# Patient Record
Sex: Male | Born: 1935 | Race: Black or African American | Hispanic: No | Smoking: Former smoker
Health system: Southern US, Community
[De-identification: ages and names within clinical notes are randomized; demographics above are authoritative.]

## PROBLEM LIST (undated history)

## (undated) DIAGNOSIS — I639 Cerebral infarction, unspecified: Secondary | ICD-10-CM

## (undated) HISTORY — DX: Cerebral infarction, unspecified: I63.9

## (undated) HISTORY — PX: VASECTOMY: SHX75

---

## 2001-09-20 ENCOUNTER — Ambulatory Visit (HOSPITAL_BASED_OUTPATIENT_CLINIC_OR_DEPARTMENT_OTHER): Admission: RE | Admit: 2001-09-20 | Discharge: 2001-09-20 | Payer: Self-pay | Admitting: General Surgery

## 2002-12-02 ENCOUNTER — Encounter: Payer: Self-pay | Admitting: Neurology

## 2002-12-02 ENCOUNTER — Encounter: Payer: Self-pay | Admitting: Emergency Medicine

## 2002-12-02 ENCOUNTER — Inpatient Hospital Stay (HOSPITAL_COMMUNITY): Admission: EM | Admit: 2002-12-02 | Discharge: 2002-12-07 | Payer: Self-pay | Admitting: Emergency Medicine

## 2002-12-04 ENCOUNTER — Encounter: Payer: Self-pay | Admitting: Neurology

## 2002-12-05 ENCOUNTER — Encounter: Payer: Self-pay | Admitting: Family Medicine

## 2002-12-07 ENCOUNTER — Inpatient Hospital Stay (HOSPITAL_COMMUNITY)
Admission: RE | Admit: 2002-12-07 | Discharge: 2003-01-10 | Payer: Self-pay | Admitting: Physical Medicine & Rehabilitation

## 2003-01-23 ENCOUNTER — Ambulatory Visit (HOSPITAL_COMMUNITY): Admission: RE | Admit: 2003-01-23 | Discharge: 2003-01-23 | Payer: Self-pay | Admitting: Internal Medicine

## 2003-07-12 ENCOUNTER — Ambulatory Visit (HOSPITAL_COMMUNITY): Admission: RE | Admit: 2003-07-12 | Discharge: 2003-07-12 | Payer: Self-pay | Admitting: Neurology

## 2010-03-15 ENCOUNTER — Encounter: Payer: Self-pay | Admitting: Physical Medicine & Rehabilitation

## 2010-03-15 ENCOUNTER — Encounter: Payer: Self-pay | Admitting: Internal Medicine

## 2010-03-16 ENCOUNTER — Encounter: Payer: Self-pay | Admitting: Neurology

## 2016-08-27 ENCOUNTER — Encounter: Payer: Self-pay | Admitting: Emergency Medicine

## 2016-08-27 ENCOUNTER — Ambulatory Visit (INDEPENDENT_AMBULATORY_CARE_PROVIDER_SITE_OTHER): Payer: Medicare Other

## 2016-08-27 ENCOUNTER — Ambulatory Visit (INDEPENDENT_AMBULATORY_CARE_PROVIDER_SITE_OTHER): Payer: Medicare Other | Admitting: Emergency Medicine

## 2016-08-27 VITALS — BP 138/74 | HR 64 | Temp 97.9°F | Resp 16

## 2016-08-27 DIAGNOSIS — R918 Other nonspecific abnormal finding of lung field: Secondary | ICD-10-CM | POA: Diagnosis not present

## 2016-08-27 DIAGNOSIS — S4991XA Unspecified injury of right shoulder and upper arm, initial encounter: Secondary | ICD-10-CM

## 2016-08-27 DIAGNOSIS — S40021A Contusion of right upper arm, initial encounter: Secondary | ICD-10-CM | POA: Insufficient documentation

## 2016-08-27 NOTE — Patient Instructions (Addendum)
     IF you received an x-ray today, you will receive an invoice from [facility]. Please contact [facility] at [phone] with questions or concerns regarding your invoice.   IF you received labwork today, you will receive an invoice from [facility]. Please contact [facility] at [phone] with questions or concerns regarding your invoice.   Our billing staff will not be able to assist you with questions regarding bills from these companies.  You will be contacted with the lab results as soon as they are available. The fastest way to get your results is to activate your My Chart account. Instructions are located on the last page of this paperwork. If you have not heard from us regarding the results in 2 weeks, please contact this office.     Contusion A contusion is a deep bruise. Contusions happen when an injury causes bleeding under the skin. Symptoms of bruising include pain, swelling, and discolored skin. The skin may turn blue, purple, or yellow. Follow these instructions at home:  Rest the injured area.  If told, put ice on the injured area. ? Put ice in a plastic bag. ? Place a towel between your skin and the bag. ? Leave the ice on for 20 minutes, 2-3 times per day.  If told, put light pressure (compression) on the injured area using an elastic bandage. Make sure the bandage is not too tight. Remove it and put it back on as told by your doctor.  If possible, raise (elevate) the injured area above the level of your heart while you are sitting or lying down.  Take over-the-counter and prescription medicines only as told by your doctor. Contact a doctor if:  Your symptoms do not get better after several days of treatment.  Your symptoms get worse.  You have trouble moving the injured area. Get help right away if:  You have very bad pain.  You have a loss of feeling (numbness) in a hand or foot.  Your hand or foot turns pale or cold. This information  is not intended to replace advice given to you by your health care provider. Make sure you discuss any questions you have with your health care provider. Document Released: 07/29/2007 Document Revised: 07/18/2015 Document Reviewed: 06/27/2014 Elsevier Interactive Patient Education  2018 Elsevier Inc.  

## 2016-08-27 NOTE — Progress Notes (Signed)
Joseph Nielsen 83 y.o.   Chief Complaint  Patient presents with  . Fall    fell at home on Monday, didn't hit any objects, per pt  complains that his right side of his body is hurting.    HISTORY OF PRESENT ILLNESS: This is a 83 y.o. male fell last Monday and injured right arm, c/o pain since. 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  HPI   Prior to Admission medications   Medication Sig Start Date End Date Taking? Authorizing Provider  ASPIRIN 81 PO Take by mouth.   Yes [provider]  Multiple Vitamin (MULTIVITAMIN) tablet Take 1 tablet by mouth daily.   Yes [provider]    Not on File  There are no active problems to display for this patient.   Past Medical History:  Diagnosis Date  . Stroke [facility](HCC)     Past Surgical History:  Procedure Laterality Date  . VASECTOMY      Social History   Social History  . Marital status: Widowed    Spouse name: N/A  . Number of children: N/A  . Years of education: N/A   Occupational History  . Not on file.   Social History Main Topics  . Smoking status: Former Smoker    Types: Cigarettes  . Smokeless tobacco: Never Used     Comment: Quit 2004  . Alcohol use No  . Drug use: Unknown  . Sexual activity: Not on file   Other Topics Concern  . Not on file   Social History Narrative  . No narrative on file    Family History  Problem Relation Age of Onset  . Cancer Mother   . Heart disease Father      ROS   Physical Exam  Constitutional: He appears well-developed and well-nourished.  Wheel-chair bound.  HENT:  Head: Normocephalic and atraumatic.  Eyes: EOM are normal. Pupils are equal, round, and reactive to light.  Neck: Normal range of motion. Neck supple.  Cardiovascular: Normal rate.   Pulmonary/Chest: Effort normal.  Abdominal: Soft.  Musculoskeletal:  RUE: mild tenderness to upper arm; rest WNL.    Neurological: He is alert.  Residual  right sided weakness; right arm contracted with muscle atrophy +speech deficit  Skin: Skin is warm and dry. Capillary refill takes less than 2 seconds.  Vitals reviewed.  Dg Chest 2 View  Result Date: 08/27/2016 CLINICAL DATA:  A VATS.  Right shoulder injury EXAM: CHEST  2 VIEW COMPARISON:  Report of a chest x-ray of December 02, 2002 FINDINGS: The lungs are well-expanded. There is an approximately 2.5 x 2 cm spiculated mass in the inferolateral aspect of the right upper lobe. Elsewhere the lungs are clear. The heart and pulmonary vascularity are normal. There is calcification in the wall of the aortic arch. There is a trace of pleural fluid blunting the posterior costophrenic angle on the right. The observed bony thorax exhibits no acute abnormality. IMPRESSION: Abnormal spiculated mass in the right upper lobe worrisome for malignancy. Small right pleural effusion. Probable underlying chronic bronchitic -smoking related changes. Thoracic aortic atherosclerosis. Electronically Signed   By: Joseph  Nielsen M.D.   On: 08/27/2016 15:10   Dg Shoulder Right  Result Date: 08/27/2016 CLINICAL DATA:  Larey SeatFell several days ago with right shoulder pain EXAM: RIGHT SHOULDER - 2+ VIEW COMPARISON:  None. FINDINGS: The right humeral head is in normal position, minimally subluxed, and the glenohumeral joint space appears normal.  The right AC joint shows only mild degenerative joint disease. No acute fracture is seen. The bones are osteopenic. However, within the periphery of the right upper lobe there is a rounded nodular lesion present worrisome for neoplasm, either primary or metastatic. CT of the chest with IV contrast media is recommended to evaluate further. IMPRESSION: 1. No fracture or dislocation.  Diffuse osteopenia. 2. Rounded mass of 2.5 cm in the periphery of the right upper lobe worrisome for either primary or metastatic disease. Recommend CT of the chest with IV contrast media. Electronically Signed   By: Joseph Nielsen M.D.   On: 08/27/2016 14:22     ASSESSMENT & PLAN: Tania was seen today for fall.  Diagnoses and all orders for this visit:  Injury of right shoulder, initial encounter -     DG Shoulder Right; Future -     DG Chest 2 View; Future  Arm contusion, right, initial encounter  Mass of upper lobe of right lung    Patient Instructions       IF you received an x-ray today, you will receive an invoice from [facility]. Please contact [facility] at [phone] with questions or concerns regarding your invoice.   IF you received labwork today, you will receive an invoice from [facility]. Please contact [facility] at [phone] with questions or concerns regarding your invoice.   Our billing staff will not be able to assist you with questions regarding bills from these companies.  You will be contacted with the lab results as soon as they are available. The fastest way to get your results is to activate your My Chart account. Instructions are located on the last page of this paperwork. If you have not heard from [place] regarding the results in 2 weeks, please contact this office.     Contusion A contusion is a deep bruise. Contusions happen when an injury causes bleeding under the skin. Symptoms of bruising include pain, swelling, and discolored skin. The skin may turn blue, purple, or yellow. Follow these instructions at home:  Rest the injured area.  If told, put ice on the injured area. ? Put ice in a plastic bag. ? Place a towel between your skin and the bag. ? Leave the ice on for 20 minutes, 2-3 times per day.  If told, put light pressure (compression) on the injured area using an elastic bandage. Make sure the bandage is not too tight. Remove it and put it back on as told by your doctor.  If possible, raise (elevate) the injured area above the level of your heart while you are sitting or lying down.  Take over-the-counter and prescription medicines only  as told by your doctor. Contact a doctor if:  Your symptoms do not get better after several days of treatment.  Your symptoms get worse.  You have trouble moving the injured area. Get help right away if:  You have very bad pain.  You have a loss of feeling (numbness) in a hand or foot.  Your hand or foot turns pale or cold. This information is not intended to replace advice given to you by your health care provider. Make sure you discuss any questions you have with your health care provider. Document Released: 07/29/2007 Document Revised: 07/18/2015 Document Reviewed: 06/27/2014 Elsevier Interactive Patient Education  2018 Elsevier Inc.      Edwina Barth, MD [facility]

## 2016-11-27 ENCOUNTER — Other Ambulatory Visit: Payer: Self-pay | Admitting: Family Medicine

## 2016-11-27 DIAGNOSIS — R918 Other nonspecific abnormal finding of lung field: Secondary | ICD-10-CM

## 2016-12-01 ENCOUNTER — Other Ambulatory Visit: Payer: Self-pay

## 2018-01-12 ENCOUNTER — Ambulatory Visit: Payer: Self-pay | Admitting: *Deleted

## 2018-01-12 NOTE — Telephone Encounter (Signed)
Pt's daughter calling, pt present during call. Pt has expressive aphasia with H/O CVA. Reports increased edema both feet and ankles, non-pitting. No redness, warmth. States right foot only with mild tenderness. Daughter states has caregiver during the day and feet were not elevated; pt is wheelchair dependant. Also reports mild facial "Puffiness" around eyes. States has had some congestion "For a while." No cough but "I can tell he has some congestion." Afebrile, denies any SOB, CP or tightness. Appt made for tomorrow with Dr. Clelia CroftShaw as no availability with Dr. Alvy BimlerSagardia or PAs. Care advise given per protocol.  Reason for Disposition . [1] MODERATE leg swelling (e.g., swelling extends up to knees) AND [2] new onset or worsening    With mild facial swelling and congestion  Answer Assessment - Initial Assessment Questions 1. ONSET: "When did the swelling start?" (e.g., minutes, hours, days)     Today 2. LOCATION: "What part of the leg is swollen?"  "Are both legs swollen or just one leg?"     Both legs 3. SEVERITY: "How bad is the swelling?" (e.g., localized; mild, moderate, severe)  - Localized - small area of swelling localized to one leg  - MILD pedal edema - swelling limited to foot and ankle, pitting edema < 1/4 inch (6 mm) deep, rest and elevation eliminate most or all swelling  - MODERATE edema - swelling of lower leg to knee, pitting edema > 1/4 inch (6 mm) deep, rest and elevation only partially reduce swelling  - SEVERE edema - swelling extends above knee, facial or hand swelling present      Ankles and feet 4. REDNESS: "Does the swelling look red or infected?"     no 5. PAIN: "Is the swelling painful to touch?" If so, ask: "How painful is it?"   (Scale 1-10; mild, moderate or severe)    Right foot tender, mild 6. FEVER: "Do you have a fever?" If so, ask: "What is it, how was it measured, and when did it start?"      no 7. CAUSE: "What do you think is causing the leg swelling?"  "Legs were not elevated during the day."  per daughter 8. MEDICAL HISTORY: "Do you have a history of heart failure, kidney disease, liver failure, or cancer?"     *No Answer* 9. RECURRENT SYMPTOM: "Have you had leg swelling before?" If so, ask: "When was the last time?" "What happened that time?"     *No Answer* 10. OTHER SYMPTOMS: "Do you have any other symptoms?" (e.g., chest pain, difficulty breathing)       Facial swelling, mild around eyes; "Sounds congested."  Protocols used: LEG SWELLING AND EDEMA-A-AH

## 2018-01-13 ENCOUNTER — Ambulatory Visit: Payer: Self-pay | Admitting: *Deleted

## 2018-01-13 ENCOUNTER — Ambulatory Visit: Payer: Self-pay | Admitting: Family Medicine

## 2018-01-23 NOTE — Telephone Encounter (Signed)
[facility]. They noted there were called to home by pt's daughter who found him in bed. She had tried to move him to the floor to start CPR but rigor mortis just started to set in (his hand was grasped firmly to bed rail and daughter couldn't hardly it off so she could move him.) EMS immediately pronounced pt DOA. Request PCP Dr. Alvy BimlerSagardia to sign off on death certificate.  Reviewed chart and pt does have Sagardia listed as PCP. This 84 yo has only been seen in our office cone ~1.5 yrs ago but noted h/o CVA with expressive aphasia and wheelchair dependent as well as Rt lung mass at that visit.  Pt daughter called office yeast noting pt had increasing edema in B lower ext and right foot mildly tender. Also c/o some subtle congestion "for a while."  Appt was made for me for today.    Agreed that Alvy BimlerSagardia would likely be willing to sign of on death certificate. They noted that [facility] will get certificate to us.

## 2018-01-23 NOTE — Telephone Encounter (Signed)
EMS, called to reach a doctor on call to pronounce a death. They were called to the patient's home and he was not breathing.  On call provider notified to pronounce death. Dr. Clelia CroftShaw notified and conference in to speak with EMS.  Reason for Disposition . Notification of death  Answer Assessment - Initial Assessment Questions 1. REASON FOR CALL or QUESTION: "What is your reason for calling today?" or "How can I best help you?" or "What question do you have that I can help answer?"     EMS calling for the doctor on call to pronounce a death 2. CALLER: Document the source of call. (e.g., laboratory, patient).     EMS  Protocols used: PCP CALL - NO TRIAGE-A-AH

## 2018-01-23 DEATH — deceased

## 2018-02-26 IMAGING — DX DG CHEST 2V
2 series · 2 of 2 positions shown · non-contrast
Comparison: Report of a chest x-ray December 02, 2002

CLINICAL DATA: A VATS.  Right shoulder injury

EXAM:
CHEST  2 VIEW

[chest pa]
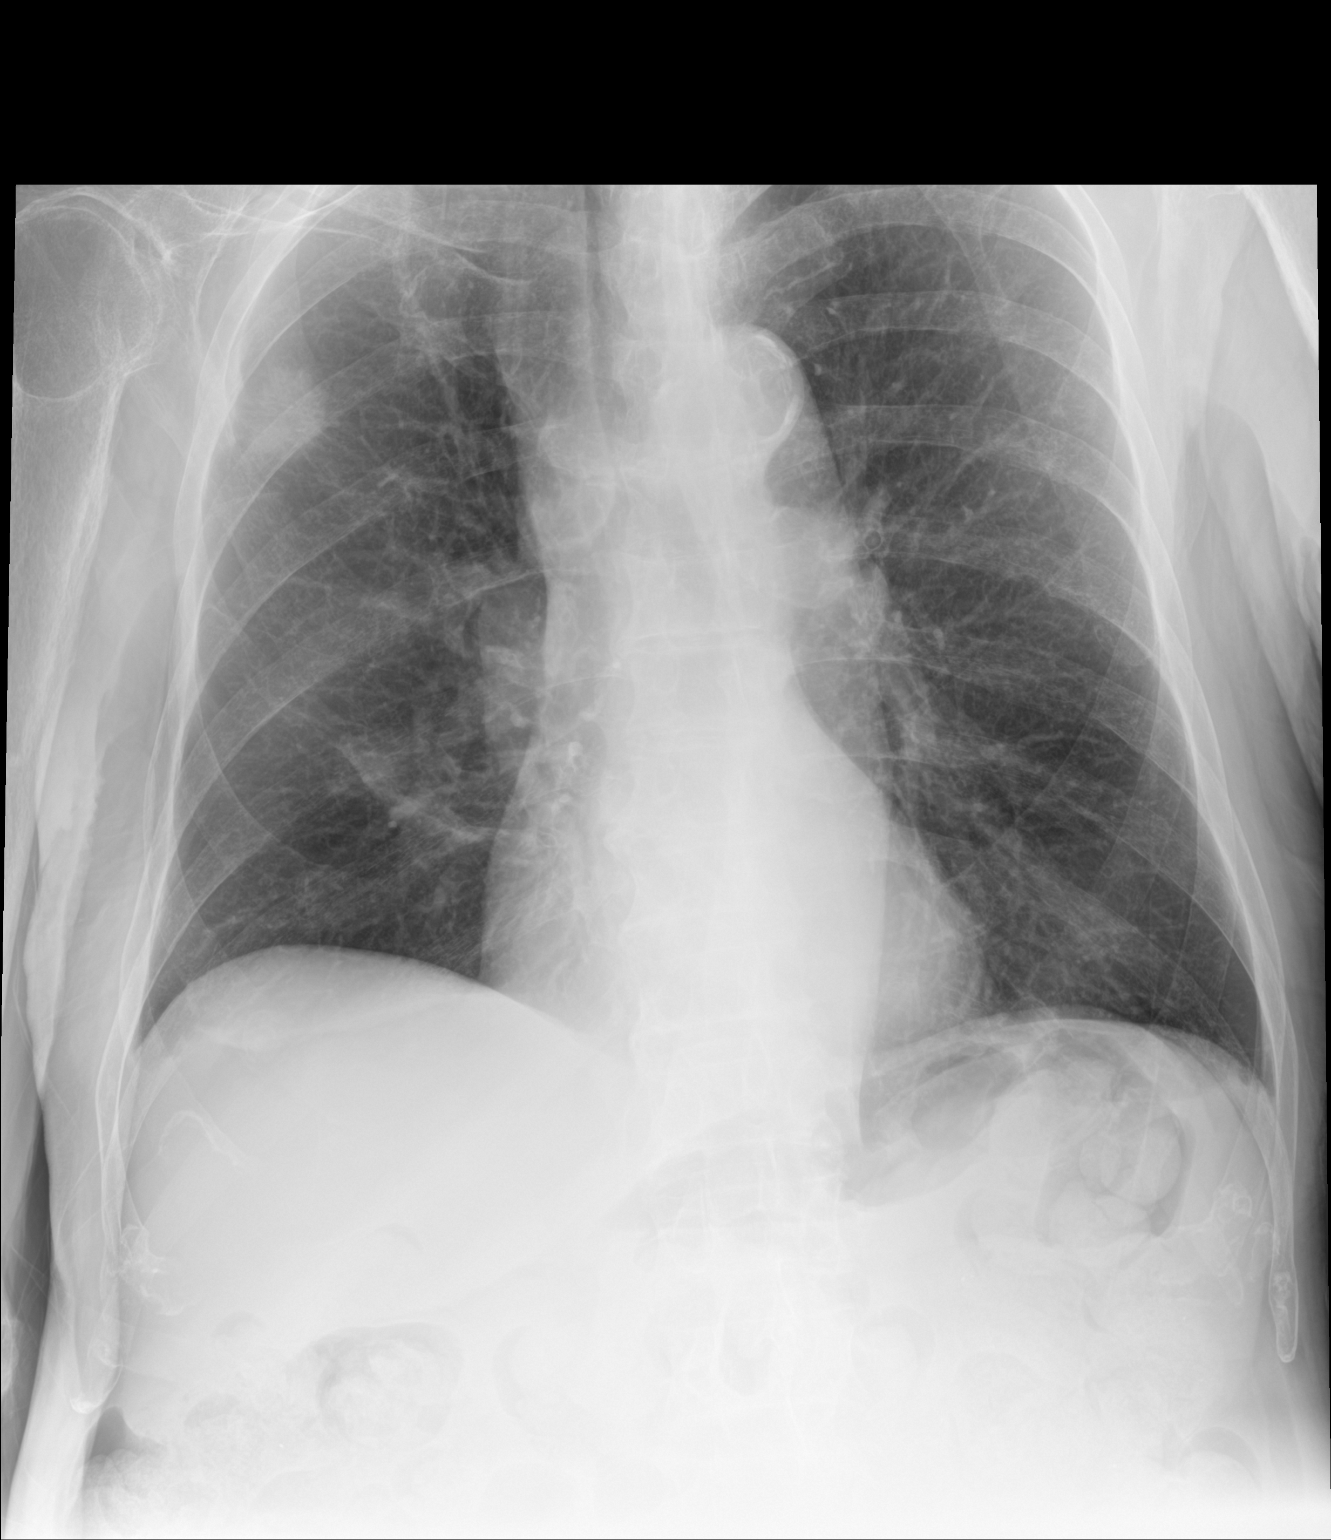

[chest lat]
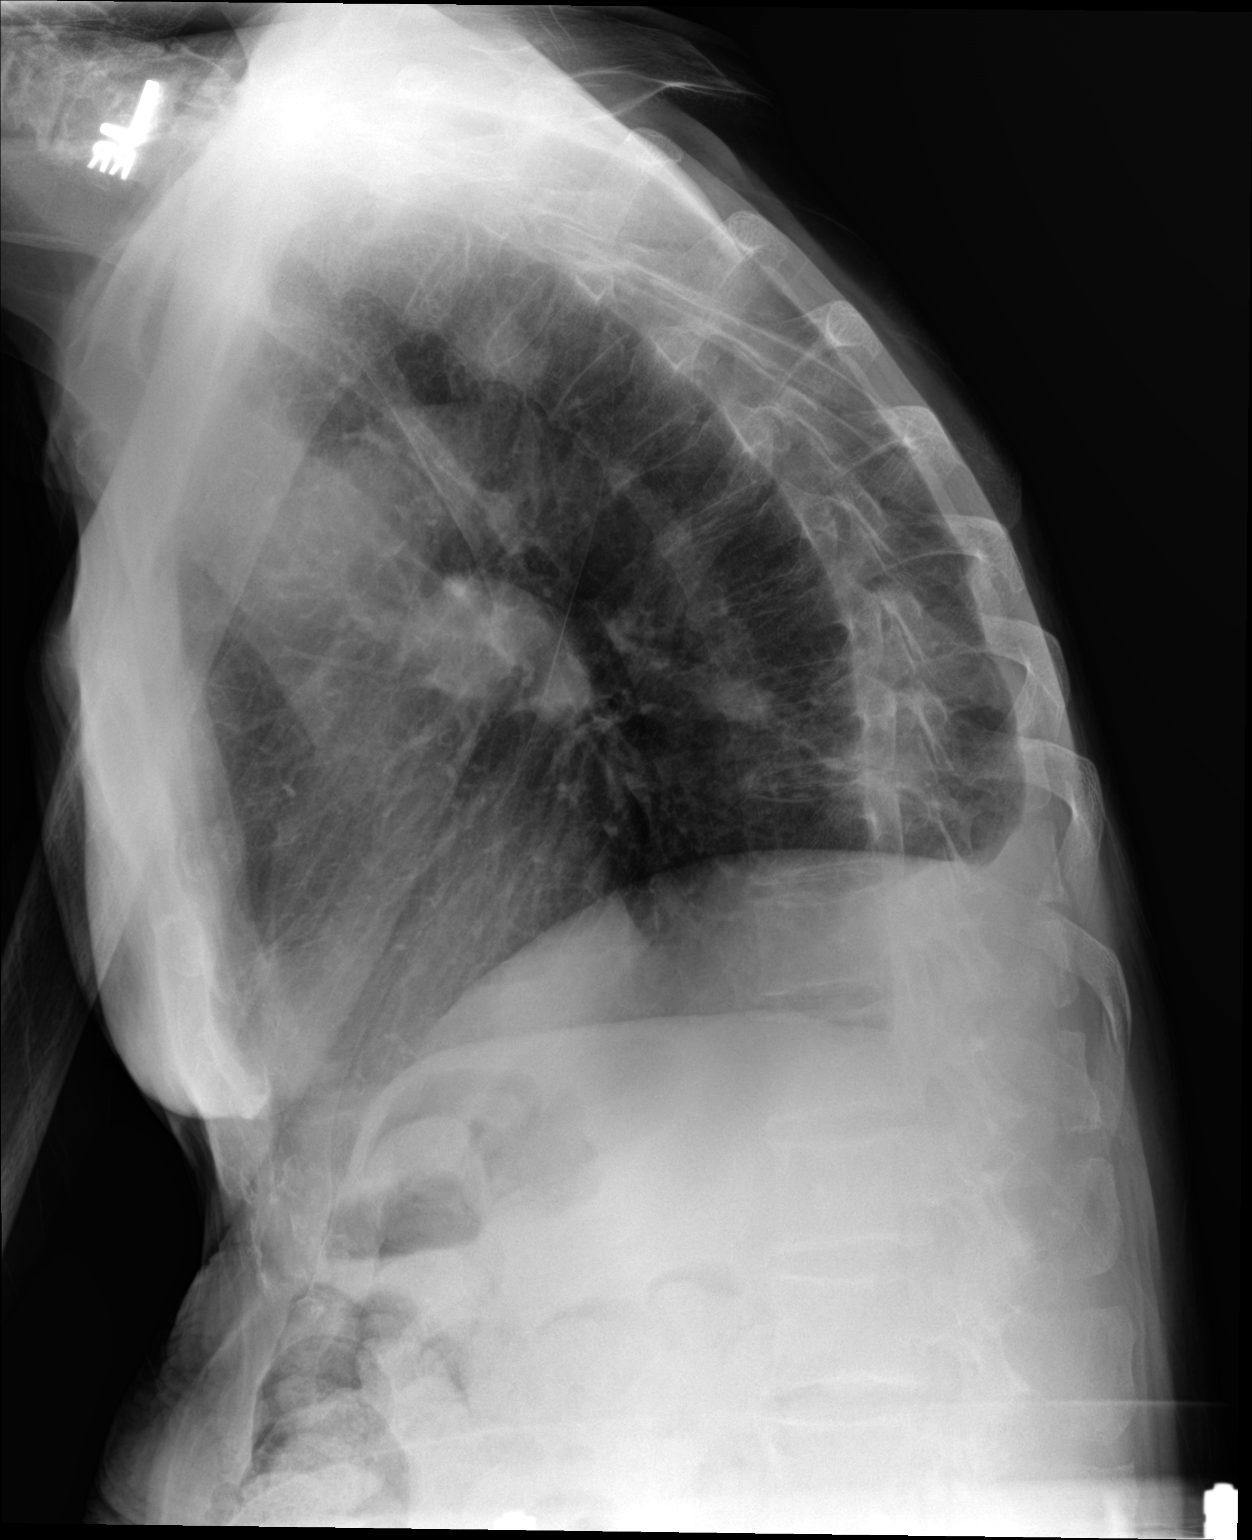

[2 of 2 positions shown; findings below may reference images not displayed]

FINDINGS: The lungs are well-expanded. There is an approximately 2.5 x 2 cm
spiculated mass in the inferolateral aspect of the right upper lobe.
Elsewhere the lungs are clear. The heart and pulmonary vascularity
are normal. There is calcification in the wall of the aortic arch.
There is a trace of pleural fluid blunting the posterior
costophrenic angle on the right. The observed bony thorax exhibits
no acute abnormality.
IMPRESSION: Abnormal spiculated mass in the right upper lobe worrisome for
malignancy. Small right pleural effusion. Probable underlying
chronic bronchitic -smoking related changes.

Thoracic aortic atherosclerosis.
